# Patient Record
Sex: Female | Born: 2000
Health system: Southern US, Community
[De-identification: ages and names within clinical notes are randomized; demographics above are authoritative.]

## PROBLEM LIST (undated history)

## (undated) DIAGNOSIS — E079 Disorder of thyroid, unspecified: Secondary | ICD-10-CM

---
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
## 2020-01-21 ENCOUNTER — Ambulatory Visit: Payer: BLUE CROSS/BLUE SHIELD | Admitting: Internal Medicine

## 2020-01-21 NOTE — Progress Notes (Deleted)
Name: Emily Carey  MRN/ DOB: [id number], [birth year]    Age/ Sex: 19 y.o., female     PCP: Patient, No Pcp Per   Reason for Endocrinology Evaluation: Hyperthyroidism     Initial Endocrinology Clinic Visit: 02/19/19    PATIENT IDENTIFIER: Emily Carey is a 19 y.o., female with unremarkable past medical history. She has followed with [facility] since 02/19/2019 for consultative assistance with management of her hyperthyroidism.   HISTORICAL SUMMARY: The patient was referred after she was found to have abnormal labs at the [facility] of [facility] during evaluation for sinus tachycardia . Her TSh was suppressed < 0.01 uIU/mL , TT4 28.5 mcg/dL    She was started on methimazole in 02/2019 at a small dose due to leukopenia on initial presentation.  Paternal grandmother with goiter  SUBJECTIVE:   Today (01/21/2020):  Emily Carey is here for a 33-month follow-up on hypothyroidism.  She does endorse missing a few doses of methimazole last week due pharmacy issues   She has been noted with weight gain  Recently has noted palpitations  Has noted diarrhea    Denies abdominal pain or fever No local neck symptoms    methimazole 5 mg, to ONE AND A HALF  tablets daily  HISTORY:   Past Medical History: No past medical history on file.  Past Surgical History: No past surgical history on file.  Social History:  reports that she has never smoked. She has never used smokeless tobacco. She reports that she does not drink alcohol. No history on file for drug use. Family History:  Family History  Problem Relation Age of Onset  . High blood pressure Mother   . High Cholesterol Father   . Diabetes Maternal Grandmother   . Kidney failure Maternal Grandfather   . Diabetes Paternal Grandmother   . Heart failure Paternal Grandfather   . Diabetes Paternal Grandfather   . Kidney failure Paternal Grandfather      HOME MEDICATIONS: Allergies as of  01/21/2020   No Known Allergies     Medication List       Accurate as of January 21, 2020  8:08 AM. If you have any questions, ask your nurse or doctor.        levonorgestrel-ethinyl estradiol 0.1-20 MG-MCG tablet Commonly known as: ALESSE Take by mouth.   methimazole 5 MG tablet Commonly known as: TAPAZOLE Take 1.5 tablets (7.5 mg total) by mouth 2 (two) times daily.         OBJECTIVE:   PHYSICAL EXAM: VS: There were no vitals taken for this visit.   EXAM: General: Pt appears well and is in NAD  Neck: General: Supple without adenopathy. Thyroid: Thyroid size is prominent.  No nodules appreciated. No thyroid bruit.  Lungs: Clear with good BS bilat with no rales, rhonchi, or wheezes  Heart: Auscultation: RRR.  Abdomen: Normoactive bowel sounds, soft, nontender, without masses or organomegaly palpable  Extremities:  BL LE: No pretibial edema normal ROM and strength.  Mental Status: Judgment, insight: Intact Orientation: Oriented to time, place, and person Mood and affect: No depression, anxiety, or agitation     DATA REVIEWED:  Results for Emily Carey (MRN [id number]) as of 09/21/2019 08:26  Ref. Range 09/20/2019 10:59  TSH Latest Ref Range: 0.40 - 5.00 uIU/mL 0.42  T4,Free(Direct) Latest Ref Range: 0.60 - 1.60 ng/dL 6.23     Results for Emily Carey (MRN [id number]) as of 05/26/2019 14:21  Ref. Range 02/19/2019 13:46  TRAB Latest Ref Range: <=2.00 IU/L 6.00 (H)    ASSESSMENT / PLAN / RECOMMENDATIONS:   1. Hyperthyroidism secondary to Graves' disease:   -Patient is clinically euthyroid -No local neck symptoms -Tolerating methimazole without side effects - TFT's are normal , will make the following adjustements  Medications  Decrease methimazole 5 mg, to ONE AND A HALF  tablets daily   2.  Graves' disease  No extrathyroidal manifestations of Graves' disease.   Follow-up in 4 months     Signed electronically by: Lyndle Herrlich,  MD  [facility] Endocrinology  [facility] [address] [place], [place] [address] Phone: [phone] FAX: [phone]      CC: Patient, No Pcp Per No address on file Phone: None  Fax: None   Return to Endocrinology clinic as below: Future Appointments  Date Time Provider Department Center  01/21/2020  3:40 PM Storm Sovine, Konrad Dolores, MD [facility] None

## 2020-02-16 ENCOUNTER — Encounter: Payer: Self-pay | Admitting: Internal Medicine

## 2020-02-16 ENCOUNTER — Ambulatory Visit (INDEPENDENT_AMBULATORY_CARE_PROVIDER_SITE_OTHER): Payer: BLUE CROSS/BLUE SHIELD | Admitting: Internal Medicine

## 2020-02-16 ENCOUNTER — Other Ambulatory Visit: Payer: Self-pay

## 2020-02-16 VITALS — BP 136/86 | HR 99 | Ht 65.0 in | Wt 219.1 lb

## 2020-02-16 DIAGNOSIS — E0590 Thyrotoxicosis, unspecified without thyrotoxic crisis or storm: Secondary | ICD-10-CM | POA: Diagnosis not present

## 2020-02-16 LAB — T4, FREE: Free T4: 0.84 ng/dL (ref 0.60–1.60)

## 2020-02-16 LAB — TSH: TSH: 0.28 u[IU]/mL — ABNORMAL LOW (ref 0.40–5.00)

## 2020-02-16 NOTE — Patient Instructions (Signed)
-   Please stop by the labs today  

## 2020-02-16 NOTE — Progress Notes (Signed)
Name: Emily Carey  MRN/ DOB: [id number], 08/24/2000    Age/ Sex: 19 y.o., female     PCP: Patient, No Pcp Per   Reason for Endocrinology Evaluation: Hyperthyroidism     Initial Endocrinology Clinic Visit: 02/19/19    PATIENT IDENTIFIER: Emily Carey is a 19 y.o., female with unremarkable past medical history. She has followed with [facility] since 02/19/2019 for consultative assistance with management of her hyperthyroidism.   HISTORICAL SUMMARY: The patient was referred after she was found to have abnormal labs at the [facility] of [facility] during evaluation for sinus tachycardia . Her TSh was suppressed < 0.01 uIU/mL , TT4 28.5 mcg/dL    She was started on methimazole in 02/2019 at a small dose due to leukopenia on initial presentation.  Paternal grandmother with goiter  SUBJECTIVE:   Today (02/16/2020):  [name] is here for a follow-up on hypothyroidism.    She has been noted with weight gain  Denies palpitations, fever or diarrhea   No local neck symptoms    methimazole 5 mg, to ONE AND A HALF  tablets daily - has been taking it twice a day     HISTORY:   Past Medical History: No past medical history on file.  Past Surgical History: No past surgical history on file.  Social History:  reports that she has never smoked. She has never used smokeless tobacco. She reports that she does not drink alcohol. No history on file for drug use. Family History:  Family History  Problem Relation Age of Onset  . High blood pressure Mother   . High Cholesterol Father   . Diabetes Maternal Grandmother   . Kidney failure Maternal Grandfather   . Diabetes Paternal Grandmother   . Heart failure Paternal Grandfather   . Diabetes Paternal Grandfather   . Kidney failure Paternal Grandfather      HOME MEDICATIONS: Allergies as of 02/16/2020   No Known Allergies     Medication List       Accurate as of February 16, 2020  8:03  AM. If you have any questions, ask your nurse or doctor.        levonorgestrel-ethinyl estradiol 0.1-20 MG-MCG tablet Commonly known as: ALESSE Take by mouth.   methimazole 5 MG tablet Commonly known as: TAPAZOLE Take 1.5 tablets (7.5 mg total) by mouth 2 (two) times daily.         OBJECTIVE:   PHYSICAL EXAM: VS: BP 136/86   Pulse 99   Ht 5\' 5"  (1.651 m)   Wt 219 lb 2 oz (99.4 kg)   LMP 02/03/2020   SpO2 98%   BMI 36.46 kg/m    EXAM: General: Pt appears well and is in NAD  Neck: General: Supple without adenopathy. Thyroid: Thyroid size is prominent.  No nodules appreciated. No thyroid bruit.  Lungs: Clear with good BS bilat with no rales, rhonchi, or wheezes  Heart: Auscultation: RRR.  Abdomen: Normoactive bowel sounds, soft, nontender, without masses or organomegaly palpable  Extremities:  BL LE: No pretibial edema normal ROM and strength.  Mental Status: Judgment, insight: Intact Orientation: Oriented to time, place, and person Mood and affect: No depression, anxiety, or agitation     DATA REVIEWED:  Results for ANALYCIA, KHOKHAR (MRN Kandis Nab) as of 02/16/2020 14:06  Ref. Range 02/16/2020 08:18  TSH Latest Ref Range: 0.40 - 5.00 uIU/mL 0.28 (L)  T4,Free(Direct) Latest Ref Range: 0.60 - 1.60 ng/dL 13/01/2020   Results  for KYLLIE, PETTIJOHN (MRN [id number]) as of 05/26/2019 14:21  Ref. Range 02/19/2019 13:46  TRAB Latest Ref Range: <=2.00 IU/L 6.00 (H)    ASSESSMENT / PLAN / RECOMMENDATIONS:   1. Hyperthyroidism secondary to Graves' disease:   -There was a miscommunication about methimazole dose and instead of taking it 1.5 tabs daily , she has been taking 1.5 tabs BID  -No local neck symptoms - TFT's today Show TSH low at 0.28 uIU/Ml. I suspect medication non adherence as she is taking double the amount that was advised on last visit and her TSH is still low  - Will discuss RAI ablation on next visit , as she has been on methimazole for a year without remission and  still requires high dose of methimazole   Medications  Continue methimazole 5 mg, 1.5 tabs BID    2.  Graves' disease  No extrathyroidal manifestations of Graves' disease.   Follow-up in 4 months Labs in 8 weeks    Addendum: Attempted to call the pt on 02/16/2020 but the call did not go through and a message appeared stating to call back later  letter will be sent      Signed electronically by: Lyndle Herrlich, MD  [facility] Endocrinology  [facility] [address] [place] [place], [place] [address] Phone: [phone] FAX: [phone]      CC: Patient, No Pcp Per No address on file Phone: None  Fax: None   Return to Endocrinology clinic as below: No future appointments.

## 2020-02-24 IMAGING — US US THYROID
1 series · 14 of 25 positions shown · non-contrast
Comparison: None.

CLINICAL DATA: Thyromegaly on exam

EXAM:
THYROID ULTRASOUND
TECHNIQUE: Ultrasound examination of the thyroid gland and adjacent soft
tissues was performed.

[Series 1: us thyroid · 0.08mm/px · 35 acquisitions, 14 frames shown]
[im 1/35]
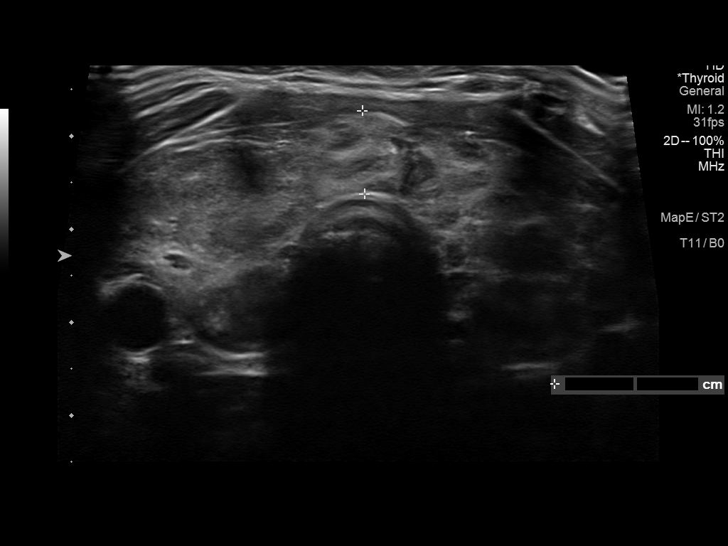
[im 3/35]
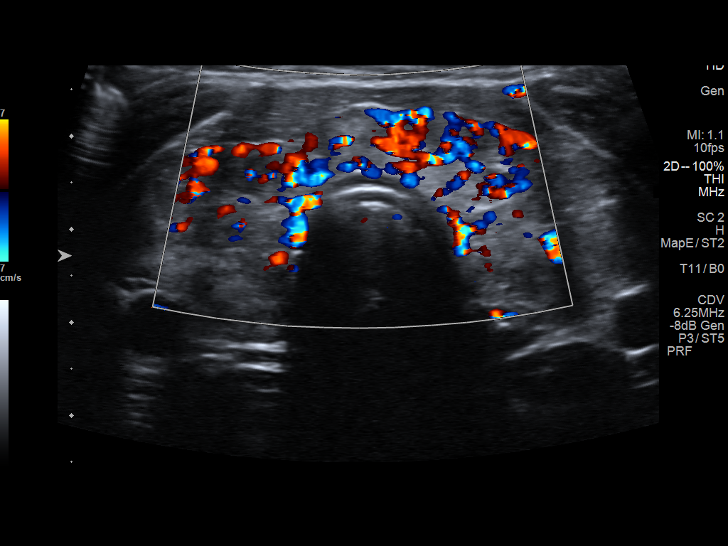
[im 6/35]
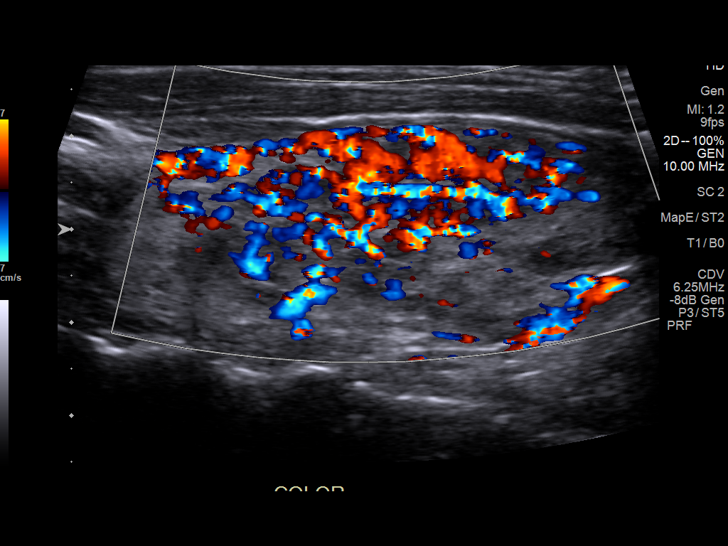
[im 9/35]
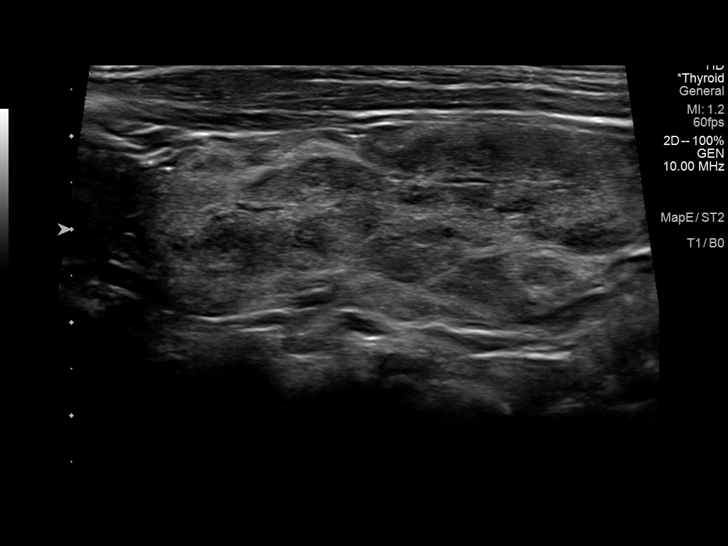
[im 12/35]
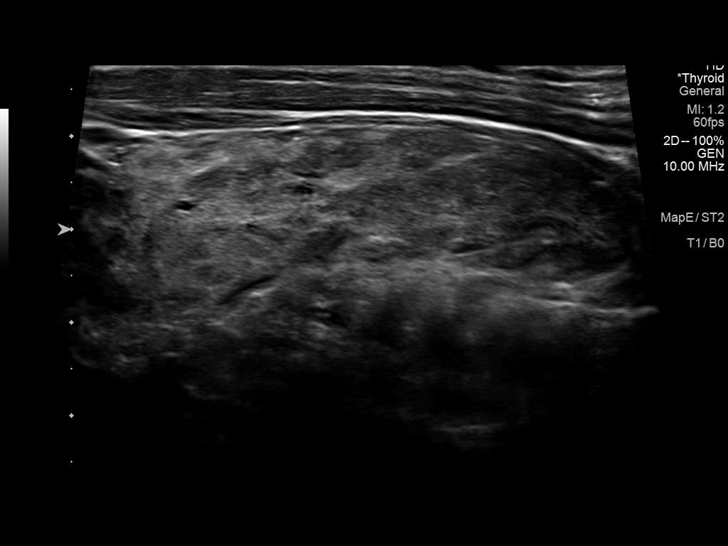
[im 13/35]
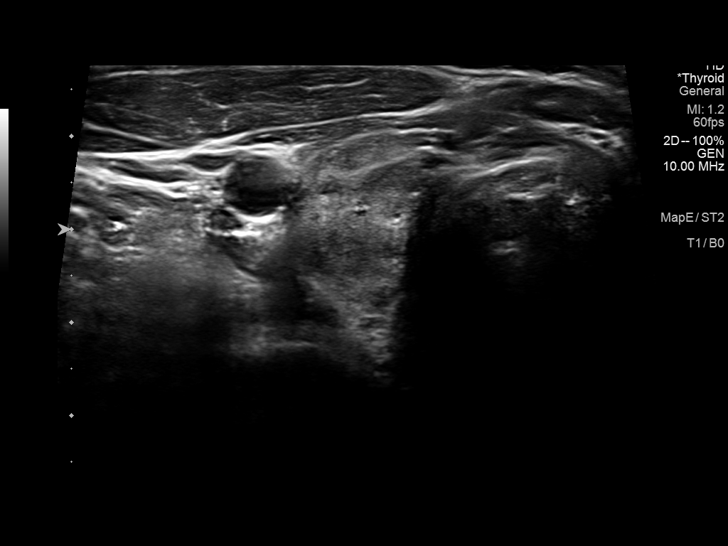
[im 16/35]
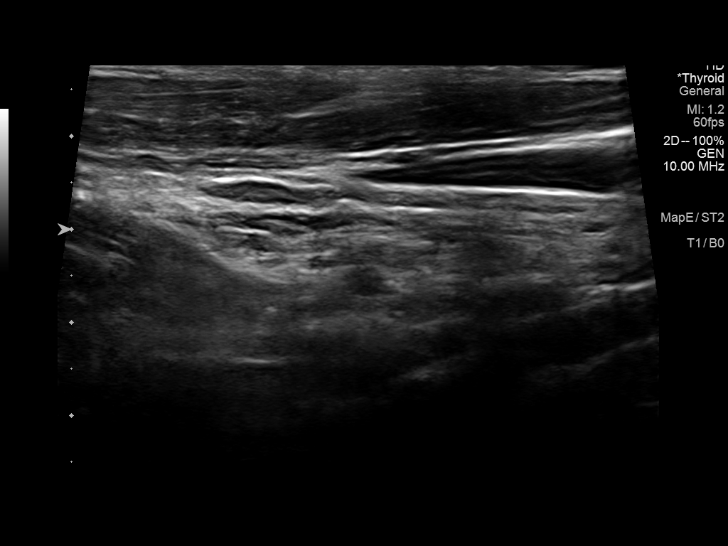
[im 19/35]
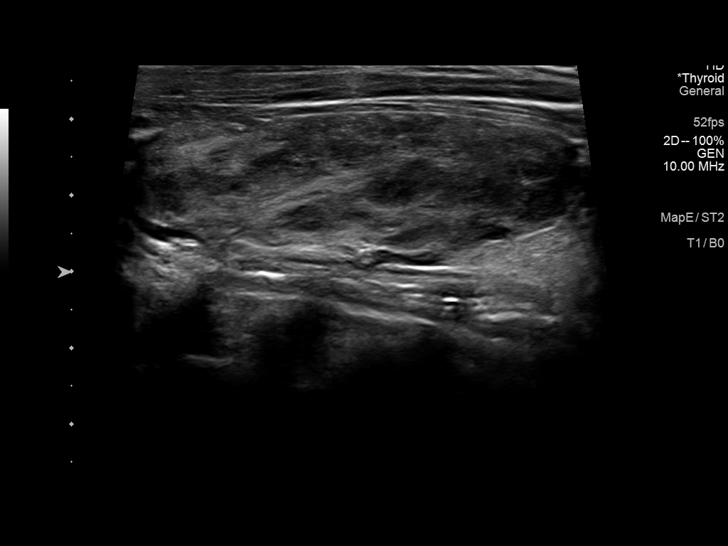
[im 22/35]
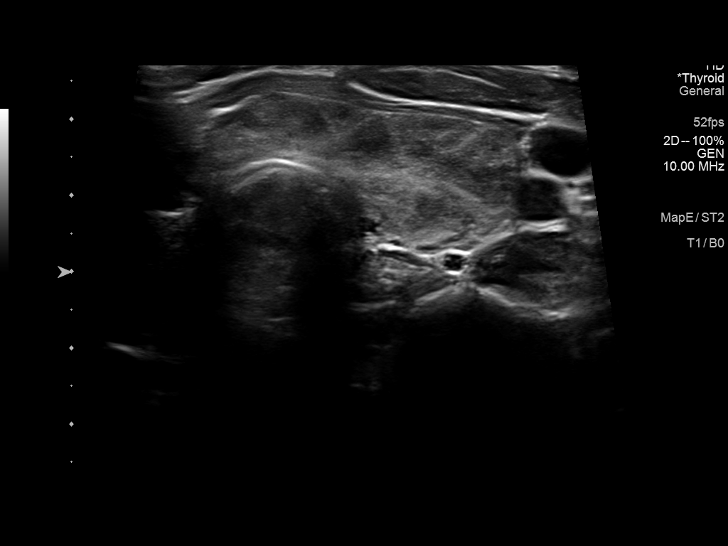
[im 23/35]
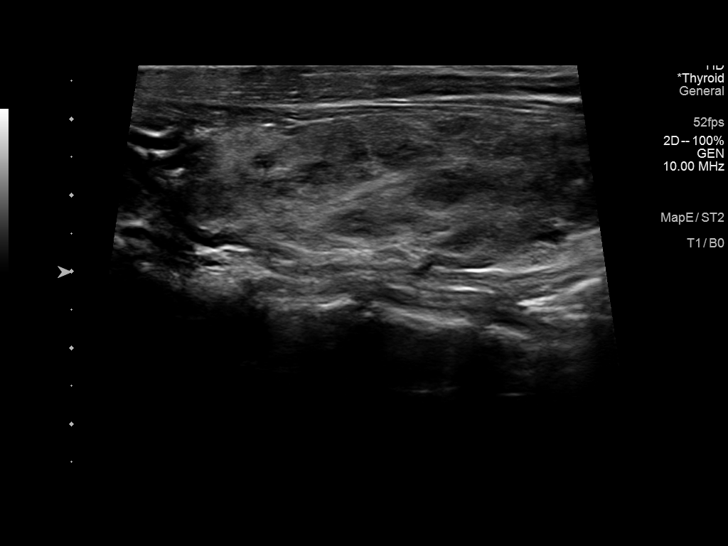
[im 26/35]
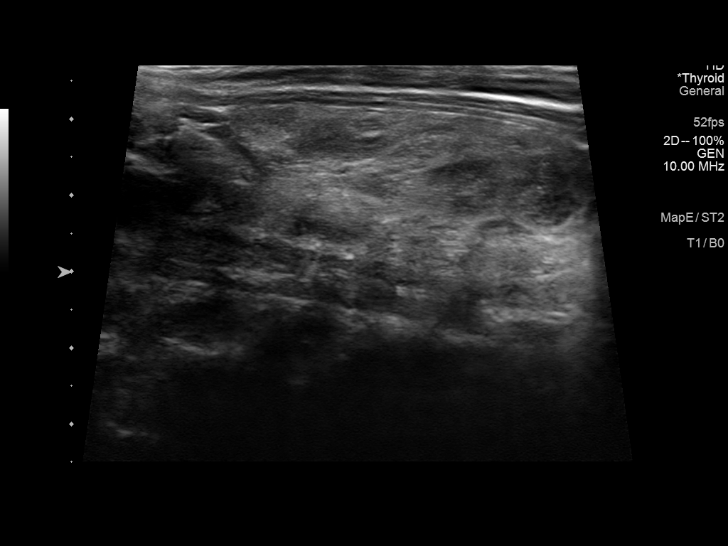
[im 29/35]
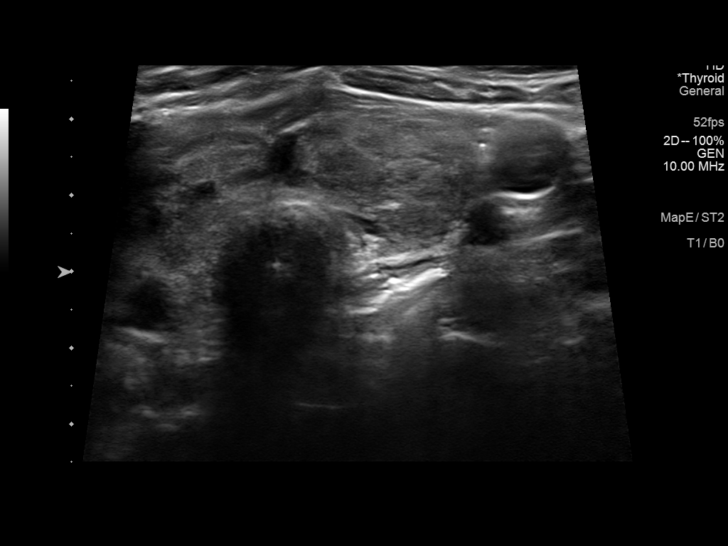
[im 32/35]
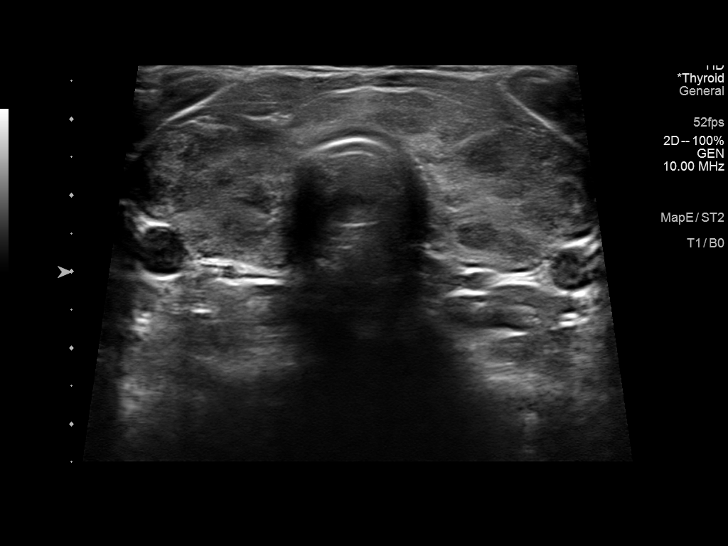
[im 35/35]
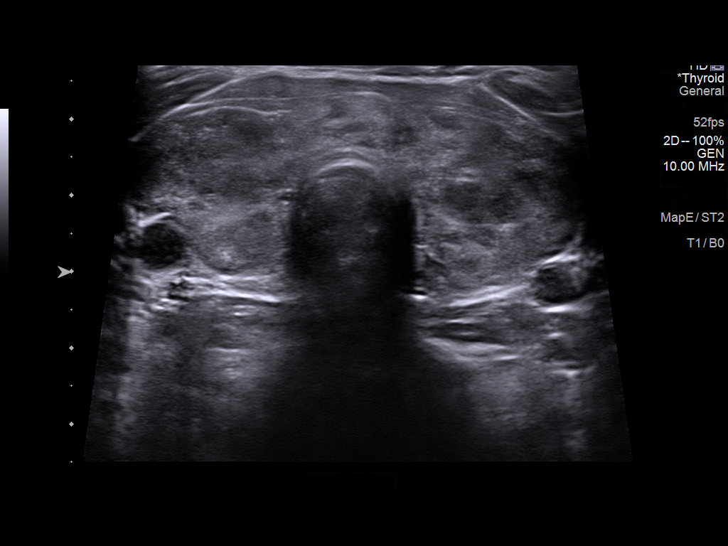

[14 of 25 positions shown; findings below may reference images not displayed]

FINDINGS: Parenchymal Echotexture: Markedly heterogenous

Isthmus: 9 mm

Right lobe: 5.7 x 2.3 x 2.3 cm

Left lobe: 6.0 x 1.9 x 2.3 cm

_________________________________________________________

Estimated total number of nodules >/= 1 cm: 0

Number of spongiform nodules >/=  2 cm not described below (TR1): 0

Number of mixed cystic and solid nodules >/= 1.5 cm not described
below (TR2): 0

_________________________________________________________

Mild thyromegaly with marked heterogeneity and background diffuse
pseudo nodularity. Thyroid is also hypervascular. Appearance is
nonspecific but can be seen with thyroiditis or Graves disease.
Correlate clinically. No regional adenopathy.
IMPRESSION: Thyromegaly with heterogeneity and hypervascularity. See above
comment.

The above is in keeping with the ACR TI-RADS recommendations - [HOSPITAL] 4496;[DATE].

## 2020-04-20 ENCOUNTER — Other Ambulatory Visit: Payer: BLUE CROSS/BLUE SHIELD

## 2020-04-26 ENCOUNTER — Other Ambulatory Visit (INDEPENDENT_AMBULATORY_CARE_PROVIDER_SITE_OTHER): Payer: BLUE CROSS/BLUE SHIELD

## 2020-04-26 ENCOUNTER — Other Ambulatory Visit: Payer: Self-pay

## 2020-04-26 DIAGNOSIS — E0590 Thyrotoxicosis, unspecified without thyrotoxic crisis or storm: Secondary | ICD-10-CM

## 2020-04-26 LAB — TSH: TSH: 0.65 u[IU]/mL (ref 0.40–5.00)

## 2020-04-26 LAB — T4, FREE: Free T4: 0.8 ng/dL (ref 0.60–1.60)

## 2020-06-14 ENCOUNTER — Other Ambulatory Visit: Payer: Self-pay

## 2020-06-16 ENCOUNTER — Ambulatory Visit (INDEPENDENT_AMBULATORY_CARE_PROVIDER_SITE_OTHER): Payer: BLUE CROSS/BLUE SHIELD | Admitting: Internal Medicine

## 2020-06-16 ENCOUNTER — Encounter: Payer: Self-pay | Admitting: Internal Medicine

## 2020-06-16 ENCOUNTER — Other Ambulatory Visit: Payer: Self-pay

## 2020-06-16 VITALS — BP 136/86 | HR 76 | Ht 65.0 in | Wt 230.2 lb

## 2020-06-16 DIAGNOSIS — E0590 Thyrotoxicosis, unspecified without thyrotoxic crisis or storm: Secondary | ICD-10-CM | POA: Diagnosis not present

## 2020-06-16 DIAGNOSIS — E0500 Thyrotoxicosis with diffuse goiter without thyrotoxic crisis or storm: Secondary | ICD-10-CM

## 2020-06-16 LAB — CBC WITH DIFFERENTIAL/PLATELET
Basophils Absolute: 0 10*3/uL (ref 0.0–0.1)
Basophils Relative: 0.8 % (ref 0.0–3.0)
Eosinophils Absolute: 0 10*3/uL (ref 0.0–0.7)
Eosinophils Relative: 0.8 % (ref 0.0–5.0)
HCT: 35.5 % — ABNORMAL LOW (ref 36.0–49.0)
Hemoglobin: 11.6 g/dL — ABNORMAL LOW (ref 12.0–16.0)
Lymphocytes Relative: 33.7 % (ref 24.0–48.0)
Lymphs Abs: 1.6 10*3/uL (ref 0.7–4.0)
MCHC: 32.8 g/dL (ref 31.0–37.0)
MCV: 82.5 fl (ref 78.0–98.0)
Monocytes Absolute: 0.5 10*3/uL (ref 0.1–1.0)
Monocytes Relative: 9.6 % (ref 3.0–12.0)
Neutro Abs: 2.7 10*3/uL (ref 1.4–7.7)
Neutrophils Relative %: 55.1 % (ref 43.0–71.0)
Platelets: 405 10*3/uL (ref 150.0–575.0)
RBC: 4.3 Mil/uL (ref 3.80–5.70)
RDW: 14.9 % (ref 11.4–15.5)
WBC: 4.9 10*3/uL (ref 4.5–13.5)

## 2020-06-16 LAB — T4, FREE: Free T4: 0.92 ng/dL (ref 0.60–1.60)

## 2020-06-16 LAB — COMPREHENSIVE METABOLIC PANEL
ALT: 26 U/L (ref 0–35)
AST: 19 U/L (ref 0–37)
Albumin: 4.1 g/dL (ref 3.5–5.2)
Alkaline Phosphatase: 42 U/L — ABNORMAL LOW (ref 47–119)
BUN: 8 mg/dL (ref 6–23)
CO2: 27 mEq/L (ref 19–32)
Calcium: 9.6 mg/dL (ref 8.4–10.5)
Chloride: 107 mEq/L (ref 96–112)
Creatinine, Ser: 0.9 mg/dL (ref 0.40–1.20)
GFR: 92.38 mL/min (ref >60.00)
Glucose, Bld: 87 mg/dL (ref 70–99)
Potassium: 4.2 mEq/L (ref 3.5–5.1)
Sodium: 140 mEq/L (ref 135–145)
Total Bilirubin: 0.4 mg/dL (ref 0.2–1.2)
Total Protein: 7.7 g/dL (ref 6.0–8.3)

## 2020-06-16 LAB — TSH: TSH: 0.31 u[IU]/mL — ABNORMAL LOW (ref 0.40–5.00)

## 2020-06-16 NOTE — Patient Instructions (Signed)
Thyroid Uptake and Scan works like this: We would first check a thyroid "scan" (a special, but easy and painless type of thyroid x ray).  you go to the x-ray department of the hospital to swallow a pill, which contains a miniscule amount of radiation.  You will not notice any symptoms from this.  You will go back to the x-ray department the next day, to lie down in front of a camera.  The results of this will be sent to me.    

## 2020-06-16 NOTE — Progress Notes (Signed)
Name: Emily Carey  MRN/ DOB: [id number], March 22, 2001    Age/ Sex: 20 y.o., female     PCP: Patient, No Pcp Per   Reason for Endocrinology Evaluation: Hyperthyroidism     Initial Endocrinology Clinic Visit: 02/19/19    PATIENT IDENTIFIER: Emily Carey is a 20 y.o., female with unremarkable past medical history. She has followed with [facility] since 02/19/2019 for consultative assistance with management of her hyperthyroidism.   HISTORICAL SUMMARY: The patient was referred after she was found to have abnormal labs at the [facility] during evaluation for sinus tachycardia . Her TSh was suppressed < 0.01 uIU/mL , TT4 28.5 mcg/dL    She was started on methimazole in 02/2019 at a small dose due to leukopenia on initial presentation.  Paternal grandmother with goiter  SUBJECTIVE:   Today (06/16/2020):  Emily Carey is here for a follow-up on hypothyroidism.    She has been noted with weight gain  Denies palpitations, fever or diarrhea   No local neck symptoms    methimazole 5 mg, to ONE AND A HALF  tablets BID  She is on Alessa for OCP's    HISTORY:   Past Medical History: No past medical history on file.  Past Surgical History: No past surgical history on file.  Social History:  reports that she has never smoked. She has never used smokeless tobacco. She reports that she does not drink alcohol. No history on file for drug use. Family History:  Family History  Problem Relation Age of Onset  . High blood pressure Mother   . High Cholesterol Father   . Diabetes Maternal Grandmother   . Kidney failure Maternal Grandfather   . Diabetes Paternal Grandmother   . Heart failure Paternal Grandfather   . Diabetes Paternal Grandfather   . Kidney failure Paternal Grandfather      HOME MEDICATIONS: Allergies as of 06/16/2020   No Known Allergies     Medication List       Accurate as of June 16, 2020  8:44 AM. If you  have any questions, ask your nurse or doctor.        levonorgestrel-ethinyl estradiol 0.1-20 MG-MCG tablet Commonly known as: ALESSE Take by mouth.   methimazole 5 MG tablet Commonly known as: TAPAZOLE Take 1.5 tablets (7.5 mg total) by mouth 2 (two) times daily.         OBJECTIVE:   PHYSICAL EXAM: VS: BP 136/86   Pulse 76   Ht 5\' 5"  (1.651 m)   Wt 230 lb 4 oz (104.4 kg)   LMP 05/13/2020   SpO2 96%   BMI 38.32 kg/m    EXAM: General: Pt appears well and is in NAD  Neck: General: Supple without adenopathy. Thyroid: Thyroid size is prominent.  No nodules appreciated. No thyroid bruit.  Lungs: Clear with good BS bilat with no rales, rhonchi, or wheezes  Heart: Auscultation: RRR.  Abdomen: Normoactive bowel sounds, soft, nontender, without masses or organomegaly palpable  Extremities:  BL LE: No pretibial edema normal ROM and strength.  Mental Status: Judgment, insight: Intact Orientation: Oriented to time, place, and person Mood and affect: No depression, anxiety, or agitation     DATA REVIEWED: Results for Emily, Carey (MRN Emily Carey) as of 06/16/2020 15:18  Ref. Range 06/16/2020 08:55  TSH Latest Ref Range: 0.40 - 5.00 uIU/mL 0.31 (L)  T4,Free(Direct) Latest Ref Range: 0.60 - 1.60 ng/dL 08/16/2020    Results for Emily Carey,  Emily Carey (MRN Emily Carey) as of 05/26/2019 14:21  Ref. Range 02/19/2019 13:46  TRAB Latest Ref Range: <=2.00 IU/L 6.00 (H)    ASSESSMENT / PLAN / RECOMMENDATIONS:   1. Hyperthyroidism secondary to Graves' disease:   - Pt is clinically euthyroid - No local neck symptoms - I have recommended RAI ablation , which she agreed to, will proceed with thyroid uptake and scan  - I carefully explained to the patient that one of the consequences of I-131 ablation treatment would likely be permanent hypothyroidism which would require long-term replacement therapy with LT4. - She was also made aware to avoid pregnancy 6 month after RAI ablation  - TSh low again,  suspect imperfect medication adherence, no changes  - She was also advised to hold methimazole 5-7 days prior the uptake and scan as well as RAI ablation     Medications  Continue methimazole 5 mg, 1.5 tabs BID    2.  Graves' disease  No extrathyroidal manifestations of Graves' disease.   Follow-up in 3 months      Signed electronically by: Lyndle Herrlich, MD  [facility]  [facility] [address] [place], [place] [address] Phone: [phone] FAX: [phone]      CC: Patient, No Pcp Per No address on file Phone: None  Fax: None   Return to Endocrinology clinic as below: No future appointments.

## 2020-07-04 ENCOUNTER — Encounter (HOSPITAL_COMMUNITY): Payer: BLUE CROSS/BLUE SHIELD

## 2020-07-05 ENCOUNTER — Encounter (HOSPITAL_COMMUNITY): Payer: BLUE CROSS/BLUE SHIELD

## 2020-07-20 ENCOUNTER — Other Ambulatory Visit: Payer: Self-pay

## 2020-07-20 ENCOUNTER — Encounter (HOSPITAL_COMMUNITY)
Admission: RE | Admit: 2020-07-20 | Discharge: 2020-07-20 | Disposition: A | Discharge status: Home / Self Care | Payer: BLUE CROSS/BLUE SHIELD | Source: Ambulatory Visit | Attending: Internal Medicine | Admitting: Internal Medicine

## 2020-07-20 ENCOUNTER — Ambulatory Visit (HOSPITAL_COMMUNITY)
Admission: RE | Admit: 2020-07-20 | Discharge: 2020-07-20 | Disposition: A | Discharge status: Home / Self Care | Payer: BLUE CROSS/BLUE SHIELD | Source: Ambulatory Visit | Attending: Internal Medicine | Admitting: Internal Medicine

## 2020-07-20 DIAGNOSIS — E0590 Thyrotoxicosis, unspecified without thyrotoxic crisis or storm: Secondary | ICD-10-CM | POA: Diagnosis not present

## 2020-07-20 DIAGNOSIS — E0500 Thyrotoxicosis with diffuse goiter without thyrotoxic crisis or storm: Secondary | ICD-10-CM | POA: Insufficient documentation

## 2020-07-21 ENCOUNTER — Ambulatory Visit (HOSPITAL_COMMUNITY)
Admission: RE | Admit: 2020-07-21 | Discharge: 2020-07-21 | Disposition: A | Discharge status: Home / Self Care | Payer: BLUE CROSS/BLUE SHIELD | Source: Ambulatory Visit | Attending: Internal Medicine | Admitting: Internal Medicine

## 2020-07-21 DIAGNOSIS — R5383 Other fatigue: Secondary | ICD-10-CM | POA: Diagnosis not present

## 2020-07-21 DIAGNOSIS — E0500 Thyrotoxicosis with diffuse goiter without thyrotoxic crisis or storm: Secondary | ICD-10-CM | POA: Diagnosis not present

## 2020-07-21 DIAGNOSIS — R197 Diarrhea, unspecified: Secondary | ICD-10-CM | POA: Diagnosis not present

## 2020-07-21 DIAGNOSIS — F419 Anxiety disorder, unspecified: Secondary | ICD-10-CM | POA: Diagnosis not present

## 2020-07-21 MED ORDER — SODIUM IODIDE I-123 7.4 MBQ CAPS
434.3000 | ORAL_CAPSULE | Freq: Once | ORAL | Status: AC
  Administered 2020-07-21: 434.3 via ORAL

## 2020-07-24 ENCOUNTER — Other Ambulatory Visit: Payer: Self-pay | Admitting: Internal Medicine

## 2020-07-24 DIAGNOSIS — E0590 Thyrotoxicosis, unspecified without thyrotoxic crisis or storm: Secondary | ICD-10-CM

## 2020-07-24 MED ORDER — ATENOLOL 25 MG PO TABS: 25.0000 mg | ORAL_TABLET | Freq: Every day | ORAL | 3 refills | Status: DC

## 2020-08-03 ENCOUNTER — Telehealth: Payer: Self-pay | Admitting: Internal Medicine

## 2020-08-03 MED ORDER — ATENOLOL 25 MG PO TABS: 25.0000 mg | ORAL_TABLET | Freq: Every day | ORAL | 3 refills | Status: DC

## 2020-08-03 NOTE — Telephone Encounter (Signed)
It was refill on 07/24/2020 but looks like there was an error. Will resent Rx

## 2020-08-03 NOTE — Telephone Encounter (Signed)
MEDICATION: atenolol  PHARMACY:   [facility] - [place], [place] [address] Bong Phone:  [phone]  Fax:  [phone]      HAS THE PATIENT CONTACTED THEIR PHARMACY?  yes  IS THIS A 90 DAY SUPPLY : no  IS PATIENT OUT OF MEDICATION: yes  IF NOT; HOW MUCH IS LEFT:   LAST APPOINTMENT DATE: @3 /02/2021  NEXT APPOINTMENT DATE:@6 /24/2022  DO WE HAVE YOUR PERMISSION TO LEAVE A DETAILED MESSAGE?:  OTHER COMMENTS:    **Let patient know to contact pharmacy at the end of the day to make sure medication is ready. **  ** Please notify patient to allow 48-72 hours to process**  **Encourage patient to contact the pharmacy for refills or they can request refills through [facility]**

## 2020-08-04 ENCOUNTER — Other Ambulatory Visit: Payer: Self-pay

## 2020-08-04 ENCOUNTER — Ambulatory Visit (HOSPITAL_COMMUNITY)
Admission: RE | Admit: 2020-08-04 | Discharge: 2020-08-04 | Disposition: A | Discharge status: Home / Self Care | Payer: BLUE CROSS/BLUE SHIELD | Source: Ambulatory Visit | Attending: Internal Medicine | Admitting: Internal Medicine

## 2020-08-04 DIAGNOSIS — E0590 Thyrotoxicosis, unspecified without thyrotoxic crisis or storm: Secondary | ICD-10-CM | POA: Diagnosis not present

## 2020-08-04 LAB — HCG, SERUM, QUALITATIVE: Preg, Serum: NEGATIVE

## 2020-08-04 MED ORDER — SODIUM IODIDE I 131 CAPSULE: 22.2000 | Freq: Once | INTRAVENOUS | Status: DC | PRN

## 2020-08-09 ENCOUNTER — Telehealth: Payer: Self-pay | Admitting: Internal Medicine

## 2020-08-09 NOTE — Telephone Encounter (Signed)
Please call the pt and schedule her for a " Lab appointment " the last week of May     Thanks

## 2020-08-29 ENCOUNTER — Other Ambulatory Visit: Payer: Self-pay

## 2020-08-29 ENCOUNTER — Other Ambulatory Visit (INDEPENDENT_AMBULATORY_CARE_PROVIDER_SITE_OTHER): Payer: BLUE CROSS/BLUE SHIELD

## 2020-08-29 DIAGNOSIS — E0590 Thyrotoxicosis, unspecified without thyrotoxic crisis or storm: Secondary | ICD-10-CM | POA: Diagnosis not present

## 2020-08-29 LAB — T4, FREE: Free T4: 1.13 ng/dL (ref 0.60–1.60)

## 2020-08-29 LAB — TSH: TSH: 0.05 u[IU]/mL — ABNORMAL LOW (ref 0.35–5.50)

## 2020-08-30 ENCOUNTER — Encounter: Payer: Self-pay | Admitting: Internal Medicine

## 2020-08-31 NOTE — Telephone Encounter (Signed)
Please advise 

## 2020-09-29 ENCOUNTER — Ambulatory Visit (INDEPENDENT_AMBULATORY_CARE_PROVIDER_SITE_OTHER): Payer: BLUE CROSS/BLUE SHIELD | Admitting: Internal Medicine

## 2020-09-29 ENCOUNTER — Other Ambulatory Visit: Payer: Self-pay

## 2020-09-29 VITALS — BP 120/80 | HR 94 | Ht 64.0 in | Wt 230.6 lb

## 2020-09-29 DIAGNOSIS — E0500 Thyrotoxicosis with diffuse goiter without thyrotoxic crisis or storm: Secondary | ICD-10-CM | POA: Diagnosis not present

## 2020-09-29 LAB — T4, FREE: Free T4: 1.09 ng/dL (ref 0.60–1.60)

## 2020-09-29 LAB — TSH: TSH: <0.01 u[IU]/mL — ABNORMAL LOW (ref 0.35–5.50)

## 2020-09-29 NOTE — Progress Notes (Signed)
Name: Emily Carey  MRN/ DOB: [id number], 2000-04-27    Age/ Sex: 20 y.o., female     PCP: Patient, No Pcp Per (Inactive)   Reason for Endocrinology Evaluation: Hyperthyroidism     Initial Endocrinology Clinic Visit: 02/19/19    PATIENT IDENTIFIER: Emily Carey is a 20 y.o., female with unremarkable past medical history. She has followed with [facility] since 02/19/2019 for consultative assistance with management of her hyperthyroidism.   HISTORICAL SUMMARY: The patient was referred after she was found to have abnormal labs at the [facility] health center of [facility] during evaluation for sinus tachycardia . Her TSh was suppressed < 0.01 uIU/mL , TT4 28.5 mcg/dL    She was started on methimazole in 02/2019 at a small dose due to leukopenia on initial presentation.  She is S/P RAI ablation on 08/04/2020 with 22.2 mCi I-131 sodium iodide orally  Paternal grandmother with goiter  SUBJECTIVE:   Today (09/29/2020):  Emily Carey is here for a follow-up on hypothyroidism.    S/P RAI ablation in 07/2020 with 22.2 mCi I-131 sodium iodide orally  Weight stable  Denies constipation  Denies local neck symptonms  Denies palpitations     She is on Alessa for OCP's    HISTORY:  Past Medical History: No past medical history on file. Past Surgical History: No past surgical history on file. Social History:  reports that she has never smoked. She has never used smokeless tobacco. She reports that she does not drink alcohol. No history on file for drug use. Family History:  Family History  Problem Relation Age of Onset   High blood pressure Mother    High Cholesterol Father    Diabetes Maternal Grandmother    Kidney failure Maternal Grandfather    Diabetes Paternal Grandmother    Heart failure Paternal Grandfather    Diabetes Paternal Grandfather    Kidney failure Paternal Grandfather      HOME MEDICATIONS: Allergies as of 09/29/2020   No Known  Allergies      Medication List        Accurate as of September 29, 2020 12:47 PM. If you have any questions, ask your nurse or doctor.          atenolol 25 MG tablet Commonly known as: TENORMIN Take 1 tablet (25 mg total) by mouth daily.   levonorgestrel-ethinyl estradiol 0.1-20 MG-MCG tablet Commonly known as: ALESSE Take by mouth.          OBJECTIVE:   PHYSICAL EXAM: VS: BP 120/80   Pulse 94   Ht 5\' 4"  (1.626 m)   Wt 230 lb 9.6 oz (104.6 kg)   SpO2 99%   BMI 39.58 kg/m    EXAM: General: Pt appears well and is in NAD  Neck: General: Supple without adenopathy. Thyroid: Thyroid size is prominent.  No nodules appreciated. No thyroid bruit.  Lungs: Clear with good BS bilat with no rales, rhonchi, or wheezes  Heart: Auscultation: RRR.  Abdomen: Normoactive bowel sounds, soft, nontender, without masses or organomegaly palpable  Extremities:  BL LE: No pretibial edema normal ROM and strength.  Mental Status: Judgment, insight: Intact Orientation: Oriented to time, place, and person Mood and affect: No depression, anxiety, or agitation     DATA REVIEWED:  Results for OLAMAE, FERRARA (MRN Kandis Nab) as of 10/01/2020 09:55  Ref. Range 09/29/2020 15:58  TSH Latest Ref Range: 0.35 - 5.50 uIU/mL <0.01 (L)  T4,Free(Direct) Latest Ref Range: 0.60 - 1.60 ng/dL  1.09    Results for KASSI, ESTEVE (MRN [id number]) as of 05/26/2019 14:21  Ref. Range 02/19/2019 13:46  TRAB Latest Ref Range: <=2.00 IU/L 6.00 (H)    ASSESSMENT / PLAN / RECOMMENDATIONS:   Hyperthyroidism secondary to Graves' disease:   - Pt is clinically euthyroid - No local neck symptoms - She is aware to avoid pregnancy 6 month after RAI ablation  - TFT's remain stable.    Medications  Continue Atenolol 25  mg daily    2.  Graves' disease  No extrathyroidal manifestations of Graves' disease.   Follow-up in 4 months  Labs in 8 weeks     Signed electronically by: Lyndle Herrlich,  MD  [facility]  [facility] [address] [place], [place] [address] Phone: [phone] FAX: [phone]      CC: Patient, No Pcp Per (Inactive) No address on file Phone: None  Fax: None   Return to Endocrinology clinic as below: Future Appointments  Date Time Provider Department Center  09/29/2020  3:40 PM Emily Carey, Konrad Dolores, MD [facility] None

## 2020-10-01 ENCOUNTER — Encounter: Payer: Self-pay | Admitting: Internal Medicine

## 2020-11-24 ENCOUNTER — Other Ambulatory Visit: Payer: Self-pay

## 2020-11-24 ENCOUNTER — Other Ambulatory Visit (INDEPENDENT_AMBULATORY_CARE_PROVIDER_SITE_OTHER): Payer: Self-pay

## 2020-11-24 ENCOUNTER — Telehealth: Payer: Self-pay | Admitting: Internal Medicine

## 2020-11-24 DIAGNOSIS — E0500 Thyrotoxicosis with diffuse goiter without thyrotoxic crisis or storm: Secondary | ICD-10-CM

## 2020-11-24 LAB — TSH: TSH: 12.3 u[IU]/mL — ABNORMAL HIGH (ref 0.35–5.50)

## 2020-11-24 LAB — T4, FREE: Free T4: 0.51 ng/dL — ABNORMAL LOW (ref 0.60–1.60)

## 2020-11-24 MED ORDER — LEVOTHYROXINE SODIUM 75 MCG PO TABS: 75.0000 ug | ORAL_TABLET | Freq: Every day | ORAL | 3 refills | Status: DC

## 2020-11-24 NOTE — Telephone Encounter (Signed)
Attempted to call the patient on 11/24/2020 at 1745.  Left a message for the patient to pick up prescription from [facility] a portal message will be sent as well    Abby Raelyn Mora, MD  [facility]  [facility] [address] [place], [place] [address] Phone: [phone] FAX: [phone]

## 2021-02-02 ENCOUNTER — Ambulatory Visit: Payer: BLUE CROSS/BLUE SHIELD | Admitting: Internal Medicine

## 2021-02-02 NOTE — Progress Notes (Deleted)
Name: Emily Carey  MRN/ DOB: [id number], October 12, 2000    Age/ Sex: 20 y.o., female     PCP: Patient, No Pcp Per (Inactive)   Reason for Endocrinology Evaluation: Hyperthyroidism     Initial Endocrinology Clinic Visit: 02/19/19    PATIENT IDENTIFIER: [name] is a 20 y.o., female with unremarkable past medical history. She has followed with [facility] since 02/19/2019 for consultative assistance with management of her hyperthyroidism.   HISTORICAL SUMMARY: The patient was referred after she was found to have abnormal labs at the [facility] during evaluation for sinus tachycardia . Her TSh was suppressed < 0.01 uIU/mL , TT4 28.5 mcg/dL    She was started on methimazole in 02/2019 at a small dose due to leukopenia on initial presentation.  She is S/P RAI ablation on 08/04/2020 with 22.2 mCi I-131 sodium iodide orally  She was started on LT-4 replacement in 11/2020  Paternal grandmother with goiter  SUBJECTIVE:   Today (02/02/2021):  Emily Carey is here for a follow-up on hypothyroidism.    S/P RAI ablation in 07/2020 with 22.2 mCi I-131 sodium iodide orally  Weight stable  Denies constipation  Denies local neck symptonms  Denies palpitations     She is on Alessa for OCP's  Levothyroxine 75 mcg daily   HISTORY:  Past Medical History: No past medical history on file. Past Surgical History: No past surgical history on file. Social History:  reports that she has never smoked. She has never used smokeless tobacco. She reports that she does not drink alcohol. No history on file for drug use. Family History:  Family History  Problem Relation Age of Onset   High blood pressure Mother    High Cholesterol Father    Diabetes Maternal Grandmother    Kidney failure Maternal Grandfather    Diabetes Paternal Grandmother    Heart failure Paternal Grandfather    Diabetes Paternal Grandfather    Kidney failure Paternal  Grandfather      HOME MEDICATIONS: Allergies as of 02/02/2021   No Known Allergies      Medication List        Accurate as of February 02, 2021  7:43 AM. If you have any questions, ask your nurse or doctor.          levonorgestrel-ethinyl estradiol 0.1-20 MG-MCG tablet Commonly known as: ALESSE Take by mouth.   levothyroxine 75 MCG tablet Commonly known as: SYNTHROID Take 1 tablet (75 mcg total) by mouth daily.          OBJECTIVE:   PHYSICAL EXAM: VS: There were no vitals taken for this visit.   EXAM: General: Pt appears well and is in NAD  Neck: General: Supple without adenopathy. Thyroid: Thyroid size is prominent.  No nodules appreciated. No thyroid bruit.  Lungs: Clear with good BS bilat with no rales, rhonchi, or wheezes  Heart: Auscultation: RRR.  Abdomen: Normoactive bowel sounds, soft, nontender, without masses or organomegaly palpable  Extremities:  BL LE: No pretibial edema normal ROM and strength.  Mental Status: Judgment, insight: Intact Orientation: Oriented to time, place, and person Mood and affect: No depression, anxiety, or agitation     DATA REVIEWED:  Results for ABRIEL, HATTERY (MRN [id number]) as of 10/01/2020 09:55  Ref. Range 09/29/2020 15:58  TSH Latest Ref Range: 0.35 - 5.50 uIU/mL <0.01 (L)  T4,Free(Direct) Latest Ref Range: 0.60 - 1.60 ng/dL 2.99    Results for AULANI, SHIPTON (MRN [id number])  as of 05/26/2019 14:21  Ref. Range 02/19/2019 13:46  TRAB Latest Ref Range: <=2.00 IU/L 6.00 (H)    ASSESSMENT / PLAN / RECOMMENDATIONS:   Hyperthyroidism secondary to Graves' disease:   - Pt is clinically euthyroid - No local neck symptoms - She is aware to avoid pregnancy 6 month after RAI ablation  - TFT's remain stable.    Medications  Continue Atenolol 25  mg daily    2.  Graves' disease  No extrathyroidal manifestations of Graves' disease.   Follow-up in 4 months   Signed electronically by: Lyndle Herrlich,  MD  [facility]  [facility] [address] [place], [place] [address] Phone: [phone] FAX: [phone]      CC: Patient, No Pcp Per (Inactive) No address on file Phone: None  Fax: None   Return to Endocrinology clinic as below: Future Appointments  Date Time Provider Department Center  02/02/2021 11:10 AM Ever Halberg, Konrad Dolores, MD [facility] None

## 2021-04-20 ENCOUNTER — Other Ambulatory Visit: Payer: Self-pay

## 2021-04-20 ENCOUNTER — Encounter: Payer: Self-pay | Admitting: Internal Medicine

## 2021-04-20 ENCOUNTER — Ambulatory Visit (INDEPENDENT_AMBULATORY_CARE_PROVIDER_SITE_OTHER): Payer: Self-pay | Admitting: Internal Medicine

## 2021-04-20 VITALS — BP 130/78 | HR 78 | Ht 64.0 in | Wt 244.0 lb

## 2021-04-20 DIAGNOSIS — E890 Postprocedural hypothyroidism: Secondary | ICD-10-CM | POA: Insufficient documentation

## 2021-04-20 DIAGNOSIS — E0500 Thyrotoxicosis with diffuse goiter without thyrotoxic crisis or storm: Secondary | ICD-10-CM

## 2021-04-20 LAB — T4, FREE: Free T4: 0.92 ng/dL (ref 0.60–1.60)

## 2021-04-20 LAB — TSH: TSH: 3.14 u[IU]/mL (ref 0.35–5.50)

## 2021-04-20 MED ORDER — LEVOTHYROXINE SODIUM 75 MCG PO TABS: 75.0000 ug | ORAL_TABLET | Freq: Every day | ORAL | 3 refills | Status: DC

## 2021-04-20 NOTE — Patient Instructions (Signed)

## 2021-04-20 NOTE — Progress Notes (Signed)
Name: Emily Carey  MRN/ DOB: [id number], 2000-09-12    Age/ Sex: 22 y.o., female     PCP: Patient, No Pcp Per (Inactive)   Reason for Endocrinology Evaluation: Hyperthyroidism     Initial Endocrinology Clinic Visit: 02/19/19    PATIENT IDENTIFIER: [name] is a 21 y.o., female with unremarkable past medical history. She has followed with [facility] since 02/19/2019 for consultative assistance with management of her hyperthyroidism.   HISTORICAL SUMMARY: The patient was referred after she was found to have abnormal labs at the [facility] health center of [facility] during evaluation for sinus tachycardia . Her TSh was suppressed < 0.01 uIU/mL , TT4 28.5 mcg/dL    She was started on methimazole in 02/2019 at a small dose due to leukopenia on initial presentation.  She is S/P RAI ablation on 08/04/2020 with 22.2 mCi I-131 sodium iodide orally Started on LT -4 replacement by 11/2020  Paternal grandmother with goiter  SUBJECTIVE:   Today (04/20/2021):  Emily Carey is here for a follow-up on hypothyroidism.    S/P RAI ablation in 07/2020 with 22.2 mCi I-131 sodium iodide orally   Weight has increased  Denies constipation  Denies local neck symptoms  Denies palpitations   She is on Alessa for OCP's  Levothyroxine 75 mcg daily     HISTORY:  Past Medical History: No past medical history on file. Past Surgical History: No past surgical history on file. Social History:  reports that she has never smoked. She has never used smokeless tobacco. She reports that she does not drink alcohol. No history on file for drug use. Family History:  Family History  Problem Relation Age of Onset   High blood pressure Mother    High Cholesterol Father    Diabetes Maternal Grandmother    Kidney failure Maternal Grandfather    Diabetes Paternal Grandmother    Heart failure Paternal Grandfather    Diabetes Paternal Grandfather    Kidney failure Paternal  Grandfather      HOME MEDICATIONS: Allergies as of 04/20/2021   No Known Allergies      Medication List        Accurate as of April 20, 2021  2:30 PM. If you have any questions, ask your nurse or doctor.          STOP taking these medications    levonorgestrel-ethinyl estradiol 0.1-20 MG-MCG tablet Commonly known as: ALESSE Stopped by: Scarlette Shorts, MD       TAKE these medications    Junel Fe 24 1-20 MG-MCG(24) tablet Generic drug: Norethindrone Acetate-Ethinyl Estrad-FE Take by mouth.   levothyroxine 75 MCG tablet Commonly known as: SYNTHROID Take 1 tablet (75 mcg total) by mouth daily.          OBJECTIVE:   PHYSICAL EXAM: VS: BP 130/78 (BP Location: Left Arm, Patient Position: Sitting, Cuff Size: Small)    Pulse 78    Ht 5\' 4"  (1.626 m)    Wt 244 lb (110.7 kg)    SpO2 99%    BMI 41.88 kg/m    EXAM: General: Pt appears well and is in NAD  Neck: General: Supple without adenopathy. Thyroid: Thyroid size is prominent.  No nodules appreciated. No thyroid bruit.  Lungs: Clear with good BS bilat with no rales, rhonchi, or wheezes  Heart: Auscultation: RRR.  Abdomen: Normoactive bowel sounds, soft, nontender, without masses or organomegaly palpable  Extremities:  BL LE: No pretibial edema normal ROM and strength.  Mental Status: Judgment, insight: Intact Orientation: Oriented to time, place, and person Mood and affect: No depression, anxiety, or agitation     DATA REVIEWED:  Latest Reference Range & Units 04/20/21 11:34  TSH 0.35 - 5.50 uIU/mL 3.14  T4,Free(Direct) 0.60 - 1.60 ng/dL 3.32     Results for AZARIAH, LATENDRESSE (MRN [id number]) as of 05/26/2019 14:21  Ref. Range 02/19/2019 13:46  TRAB Latest Ref Range: <=2.00 IU/L 6.00 (H)    ASSESSMENT / PLAN / RECOMMENDATIONS:   Postablative Hypothyroidism :   - S/P RAI ablation 07/2020 - Pre-conception counseling done to increase LT-4 replacement dose by 20 % with positive pregnancy test  and to notify endocrinology  - Pt is clinically euthyroid - No local neck symptoms -TFTs normal    Medications  Continue levothyroxine 75 mcg daily    2.  Graves' disease  No extrathyroidal manifestations of Graves' disease.   Follow-up in 6 months  Labs in 8 weeks     Signed electronically by: Lyndle Herrlich, MD  [facility] Endocrinology  [facility] [address] [place], [place] [address] Phone: [phone] FAX: [phone]      CC: Patient, No Pcp Per (Inactive) No address on file Phone: None  Fax: None   Return to Endocrinology clinic as below: Future Appointments  Date Time Provider Department Center  06/22/2021  1:45 PM [facility] None  10/19/2021  3:00 PM Kyona Chauncey, Konrad Dolores, MD [facility] None

## 2021-06-22 ENCOUNTER — Other Ambulatory Visit: Payer: Self-pay

## 2021-06-22 ENCOUNTER — Other Ambulatory Visit (INDEPENDENT_AMBULATORY_CARE_PROVIDER_SITE_OTHER): Payer: BC Managed Care – PPO

## 2021-06-22 DIAGNOSIS — E890 Postprocedural hypothyroidism: Secondary | ICD-10-CM

## 2021-06-22 DIAGNOSIS — E0500 Thyrotoxicosis with diffuse goiter without thyrotoxic crisis or storm: Secondary | ICD-10-CM

## 2021-06-22 LAB — TSH: TSH: 3.66 u[IU]/mL (ref 0.35–5.50)

## 2021-06-22 LAB — T4, FREE: Free T4: 0.82 ng/dL (ref 0.60–1.60)

## 2021-07-21 DIAGNOSIS — M5459 Other low back pain: Secondary | ICD-10-CM | POA: Diagnosis not present

## 2021-07-27 DIAGNOSIS — E039 Hypothyroidism, unspecified: Secondary | ICD-10-CM | POA: Diagnosis not present

## 2021-07-27 DIAGNOSIS — M549 Dorsalgia, unspecified: Secondary | ICD-10-CM | POA: Diagnosis not present

## 2021-07-27 DIAGNOSIS — Z6841 Body Mass Index (BMI) 40.0 and over, adult: Secondary | ICD-10-CM | POA: Diagnosis not present

## 2021-07-27 DIAGNOSIS — N390 Urinary tract infection, site not specified: Secondary | ICD-10-CM | POA: Diagnosis not present

## 2021-07-27 DIAGNOSIS — A499 Bacterial infection, unspecified: Secondary | ICD-10-CM | POA: Diagnosis not present

## 2021-07-27 DIAGNOSIS — G8929 Other chronic pain: Secondary | ICD-10-CM | POA: Diagnosis not present

## 2021-07-27 DIAGNOSIS — Z0000 Encounter for general adult medical examination without abnormal findings: Secondary | ICD-10-CM | POA: Diagnosis not present

## 2021-07-27 DIAGNOSIS — Z0130 Encounter for examination of blood pressure without abnormal findings: Secondary | ICD-10-CM | POA: Diagnosis not present

## 2021-07-27 DIAGNOSIS — R5383 Other fatigue: Secondary | ICD-10-CM | POA: Diagnosis not present

## 2021-07-27 DIAGNOSIS — R300 Dysuria: Secondary | ICD-10-CM | POA: Diagnosis not present

## 2021-10-19 ENCOUNTER — Ambulatory Visit: Payer: Self-pay | Admitting: Internal Medicine

## 2021-10-19 NOTE — Progress Notes (Deleted)
Name: Emily Carey  MRN/ DOB: [id number], 2001-02-03    Age/ Sex: 21 y.o., female     PCP: Patient, No Pcp Per   Reason for Endocrinology Evaluation: Hyperthyroidism     Initial Endocrinology Clinic Visit: 02/19/19    PATIENT IDENTIFIER: Emily Carey is a 21 y.o., female with unremarkable past medical history. She has followed with [facility] since 02/19/2019 for consultative assistance with management of her hyperthyroidism.   HISTORICAL SUMMARY: The patient was referred after she was found to have abnormal labs at the [facility] center of [facility] during evaluation for sinus tachycardia . Her TSh was suppressed < 0.01 uIU/mL , TT4 28.5 mcg/dL    She was started on methimazole in 02/2019 at a small dose due to leukopenia on initial presentation.  She is S/P RAI ablation on 08/04/2020 with 22.2 mCi I-131 sodium iodide orally Started on LT -4 replacement by 11/2020  Paternal grandmother with goiter  SUBJECTIVE:   Today (10/19/2021):  Emily Carey is here for a follow-up on hypothyroidism.    S/P RAI ablation in 07/2020 with 22.2 mCi I-131 sodium iodide orally   Weight has increased  Denies constipation  Denies local neck symptoms  Denies palpitations   She is on Alessa for OCP's  Levothyroxine 75 mcg daily     HISTORY:  Past Medical History: No past medical history on file. Past Surgical History: No past surgical history on file. Social History:  reports that she has never smoked. She has never used smokeless tobacco. She reports that she does not drink alcohol. No history on file for drug use. Family History:  Family History  Problem Relation Age of Onset   High blood pressure Mother    High Cholesterol Father    Diabetes Maternal Grandmother    Kidney failure Maternal Grandfather    Diabetes Paternal Grandmother    Heart failure Paternal Grandfather    Diabetes Paternal Grandfather    Kidney failure Paternal Grandfather       HOME MEDICATIONS: Allergies as of 10/19/2021   No Known Allergies      Medication List        Accurate as of October 19, 2021  2:02 PM. If you have any questions, ask your nurse or doctor.          Junel Fe 24 1-20 MG-MCG(24) tablet Generic drug: Norethindrone Acetate-Ethinyl Estrad-FE Take by mouth.   levothyroxine 75 MCG tablet Commonly known as: SYNTHROID Take 1 tablet (75 mcg total) by mouth daily.          OBJECTIVE:   PHYSICAL EXAM: VS: There were no vitals taken for this visit.   EXAM: General: Pt appears well and is in NAD  Neck: General: Supple without adenopathy. Thyroid: Thyroid size is prominent.  No nodules appreciated. No thyroid bruit.  Lungs: Clear with good BS bilat with no rales, rhonchi, or wheezes  Heart: Auscultation: RRR.  Abdomen: Normoactive bowel sounds, soft, nontender, without masses or organomegaly palpable  Extremities:  BL LE: No pretibial edema normal ROM and strength.  Mental Status: Judgment, insight: Intact Orientation: Oriented to time, place, and person Mood and affect: No depression, anxiety, or agitation     DATA REVIEWED:  Latest Reference Range & Units 04/20/21 11:34  TSH 0.35 - 5.50 uIU/mL 3.14  T4,Free(Direct) 0.60 - 1.60 ng/dL 4.58     Results for Emily, Carey (MRN [id number]) as of 05/26/2019 14:21  Ref. Range 02/19/2019 13:46  TRAB Latest Ref  Range: <=2.00 IU/L 6.00 (H)    ASSESSMENT / PLAN / RECOMMENDATIONS:   Postablative Hypothyroidism :   - S/P RAI ablation 07/2020 - Pre-conception counseling done to increase LT-4 replacement dose by 20 % with positive pregnancy test and to notify endocrinology  - Pt is clinically euthyroid - No local neck symptoms -TFTs normal    Medications  Continue levothyroxine 75 mcg daily    2.  Graves' disease  No extrathyroidal manifestations of Graves' disease.   Follow-up in 6 months     Signed electronically by: Lyndle Herrlich,  MD  [facility]inology  [facility] [address] [place], [place] [address] Phone: [phone] FAX: [phone]      CC: Patient, No Pcp Per No address on file Phone: None  Fax: None   Return to Endocrinology clinic as below: Future Appointments  Date Time Provider Department Center  10/19/2021  3:00 PM Shanee Batch, Konrad Dolores, MD [facility] None

## 2021-10-23 ENCOUNTER — Encounter: Payer: Self-pay | Admitting: Internal Medicine

## 2021-10-23 ENCOUNTER — Ambulatory Visit (INDEPENDENT_AMBULATORY_CARE_PROVIDER_SITE_OTHER): Payer: BC Managed Care – PPO | Admitting: Internal Medicine

## 2021-10-23 VITALS — BP 126/84 | HR 100 | Ht 64.0 in | Wt 255.0 lb

## 2021-10-23 DIAGNOSIS — E890 Postprocedural hypothyroidism: Secondary | ICD-10-CM | POA: Diagnosis not present

## 2021-10-23 DIAGNOSIS — E0500 Thyrotoxicosis with diffuse goiter without thyrotoxic crisis or storm: Secondary | ICD-10-CM | POA: Diagnosis not present

## 2021-10-23 DIAGNOSIS — Z8669 Personal history of other diseases of the nervous system and sense organs: Secondary | ICD-10-CM | POA: Diagnosis not present

## 2021-10-23 LAB — T4, FREE: Free T4: 0.87 ng/dL (ref 0.60–1.60)

## 2021-10-23 LAB — TSH: TSH: 3.22 u[IU]/mL (ref 0.35–5.50)

## 2021-10-23 MED ORDER — LEVOTHYROXINE SODIUM 75 MCG PO TABS: 75.0000 ug | ORAL_TABLET | Freq: Every day | ORAL | 3 refills | Status: DC

## 2021-10-23 NOTE — Progress Notes (Signed)
Name: Emily Carey  MRN/ DOB: [id number], 02/20/2001    Age/ Sex: 21 y.o., female     PCP: Patient, No Pcp Per   Reason for Endocrinology Evaluation: Hyperthyroidism     Initial Endocrinology Clinic Visit: 02/19/19    PATIENT IDENTIFIER: Ms. Kaleiah Kutzer is a 21 y.o., female with unremarkable past medical history. She has followed with [facility] since 02/19/2019 for consultative assistance with management of her hyperthyroidism.   HISTORICAL SUMMARY: The patient was referred after she was found to have abnormal labs at the [facility] during evaluation for sinus tachycardia . Her TSh was suppressed < 0.01 uIU/mL , TT4 28.5 mcg/dL    She was started on methimazole in 02/2019 at a small dose due to leukopenia on initial presentation.  She is S/P RAI ablation on 08/04/2020 with 22.2 mCi I-131 sodium iodide orally Started on LT -4 replacement by 11/2020  Paternal grandmother with goiter  SUBJECTIVE:   Today (10/23/2021):  Ms. Ordway is here for a follow-up on hypothyroidism.    S/P RAI ablation in 07/2020 with 22.2 mCi I-131 sodium iodide orally   Weight has increased  Denies constipation  Denies local neck symptoms  Denies palpitations  Has noted headaches for the past 4 months , she has hx of migraine , the headaches are located over the right eye, no recent changes in vision, Ibuprofen helps with the pain, throbbing pain , 1-2 a month  Denies congestion  Sleeps well at night   She is on Alessa for OCP's    Levothyroxine 75 mcg daily     HISTORY:  Past Medical History: No past medical history on file. Past Surgical History: No past surgical history on file. Social History:  reports that she has never smoked. She has never used smokeless tobacco. She reports that she does not drink alcohol. No history on file for drug use. Family History:  Family History  Problem Relation Age of Onset   High blood pressure Mother     High Cholesterol Father    Diabetes Maternal Grandmother    Kidney failure Maternal Grandfather    Diabetes Paternal Grandmother    Heart failure Paternal Grandfather    Diabetes Paternal Grandfather    Kidney failure Paternal Grandfather      HOME MEDICATIONS: Allergies as of 10/23/2021   No Known Allergies      Medication List        Accurate as of October 23, 2021  2:02 PM. If you have any questions, ask your nurse or doctor.          Junel Fe 24 1-20 MG-MCG(24) tablet Generic drug: Norethindrone Acetate-Ethinyl Estrad-FE Take by mouth.   levothyroxine 75 MCG tablet Commonly known as: SYNTHROID Take 1 tablet (75 mcg total) by mouth daily.          OBJECTIVE:   PHYSICAL EXAM: VS: BP 126/84 (BP Location: Left Arm, Patient Position: Sitting, Cuff Size: Large)   Pulse 100   Ht 5\' 4"  (1.626 m)   Wt 255 lb (115.7 kg)   SpO2 95%   BMI 43.77 kg/m    EXAM: General: Pt appears well and is in NAD  Neck: General: Supple without adenopathy. Thyroid: Thyroid size is prominent.  No nodules appreciated.  Lungs: Clear with good BS bilat with no rales, rhonchi, or wheezes  Heart: Auscultation: RRR.  Abdomen: Normoactive bowel sounds, soft, nontender, without masses or organomegaly palpable  Extremities:  BL LE:  No pretibial edema normal ROM and strength.  Mental Status: Judgment, insight: Intact Orientation: Oriented to time, place, and person Mood and affect: No depression, anxiety, or agitation     DATA REVIEWED:  Latest Reference Range & Units 10/23/21 10:36  TSH 0.35 - 5.50 uIU/mL 3.22  T4,Free(Direct) 0.60 - 1.60 ng/dL 6.54    Results for JAYLON, GRODE (MRN [id number]) as of 05/26/2019 14:21  Ref. Range 02/19/2019 13:46  TRAB Latest Ref Range: <=2.00 IU/L 6.00 (H)    ASSESSMENT / PLAN / RECOMMENDATIONS:   Postablative Hypothyroidism :   - S/P RAI ablation 07/2020 - Pre-conception counseling done to increase LT-4 replacement dose by 20 % with positive  pregnancy test and to notify endocrinology  - Pt is clinically euthyroid - No local neck symptoms -TFTs normal     Medications  Continue levothyroxine 75 mcg daily    2.  Graves' disease  No extrathyroidal manifestations of Graves' disease.  3. Headaches :   - She has hx of migraine headaches, these have not been as frequent 1-2x a month - Ibuprofen helps - Discussed other D/D tension headaches and rebound headaches, pre-menstrual headaches   - I have advised her to keep a food diary     Follow-up in 6 months     Signed electronically by: Lyndle Herrlich, MD  [facility] End[facility]inology  [facility] [address] [place] [place], [place] [address] Phone: [phone] FAX: [phone]      CC: Patient, No Pcp Per No address on file Phone: None  Fax: None   Return to Endocrinology clinic as below: Future Appointments  Date Time Provider Department Center  04/25/2022  9:30 AM Magdelena Kinsella, Konrad Dolores, MD [id number] None

## 2021-10-23 NOTE — Patient Instructions (Signed)

## 2022-04-15 DIAGNOSIS — Z3009 Encounter for other general counseling and advice on contraception: Secondary | ICD-10-CM | POA: Diagnosis not present

## 2022-04-15 DIAGNOSIS — Z113 Encounter for screening for infections with a predominantly sexual mode of transmission: Secondary | ICD-10-CM | POA: Diagnosis not present

## 2022-04-15 DIAGNOSIS — Z114 Encounter for screening for human immunodeficiency virus [HIV]: Secondary | ICD-10-CM | POA: Diagnosis not present

## 2022-04-15 DIAGNOSIS — A5901 Trichomonal vulvovaginitis: Secondary | ICD-10-CM | POA: Diagnosis not present

## 2022-04-24 NOTE — Progress Notes (Signed)
Name: Emily Carey  MRN/ DOB: [id number], February 15, 2001    Age/ Sex: 22 y.o., female     PCP: Patient, No Pcp Per   Reason for Endocrinology Evaluation: Hyperthyroidism     Initial Endocrinology Clinic Visit: 02/19/19    PATIENT IDENTIFIER: Emily Carey is a 22 y.o., female with unremarkable past medical history. She has followed with [facility] since 02/19/2019 for consultative assistance with management of her hyperthyroidism.   HISTORICAL SUMMARY: The patient was referred after she was found to have abnormal labs at the [facility] [place] during evaluation for sinus tachycardia . Her TSh was suppressed < 0.01 uIU/mL , TT4 28.5 mcg/dL    She was started on methimazole in 02/2019 at a small dose due to leukopenia on initial presentation.  She is S/P RAI ablation on 08/04/2020 with 22.2 mCi I-131 sodium iodide orally Started on LT -4 replacement by 11/2020  Paternal grandmother with goiter  SUBJECTIVE:   Today (04/25/2022):  Emily Carey is here for a follow-up on hypothyroidism.    S/P RAI ablation in 07/2020 with 22.2 mCi I-131 sodium iodide orally   Weight has been fluctuating  A couple of weeks ago had diarrhea but this has resolved  Denies local neck symptoms  Denies palpitations  Denies palpitations  She is on Alessa for OCP's  Denies burning and itching of eyes  Levothyroxine 75 mcg daily     HISTORY:  Past Medical History: No past medical history on file. Past Surgical History: No past surgical history on file. Social History:  reports that she has never smoked. She has never used smokeless tobacco. She reports that she does not drink alcohol. No history on file for drug use. Family History:  Family History  Problem Relation Age of Onset   High blood pressure Mother    High Cholesterol Father    Diabetes Maternal Grandmother    Kidney failure Maternal Grandfather    Diabetes Paternal Grandmother    Heart failure  Paternal Grandfather    Diabetes Paternal Grandfather    Kidney failure Paternal Grandfather      HOME MEDICATIONS: Allergies as of 04/25/2022   No Known Allergies      Medication List        Accurate as of April 25, 2022  9:46 AM. If you have any questions, ask your nurse or doctor.          Junel Fe 24 1-20 MG-MCG(24) tablet Generic drug: Norethindrone Acetate-Ethinyl Estrad-FE Take by mouth.   levothyroxine 75 MCG tablet Commonly known as: SYNTHROID Take 1 tablet (75 mcg total) by mouth daily.          OBJECTIVE:   PHYSICAL EXAM: VS: BP 114/72 (BP Location: Left Arm, Patient Position: Sitting, Cuff Size: Large)   Pulse 95   Ht 5\' 4"  (1.626 m)   Wt 257 lb (116.6 kg)   SpO2 99%   BMI 44.11 kg/m    EXAM: General: Pt appears well and is in NAD  Neck: General: Supple without adenopathy. Thyroid: Thyroid size is prominent.  No nodules appreciated.  Lungs: Clear with good BS bilat with no rales, rhonchi, or wheezes  Heart: Auscultation: RRR.  Abdomen: Normoactive bowel sounds, soft, nontender, without masses or organomegaly palpable  Extremities:  BL LE: No pretibial edema normal ROM and strength.  Mental Status: Judgment, insight: Intact Orientation: Oriented to time, place, and person Mood and affect: No depression, anxiety, or agitation  DATA REVIEWED:  Latest Reference Range & Units 04/25/22 09:59  TSH 0.35 - 5.50 uIU/mL 2.85    Results for Emily Carey (MRN [id number]) as of 05/26/2019 14:21  Ref. Range 02/19/2019 13:46  TRAB Latest Ref Range: <=2.00 IU/L 6.00 (H)      ASSESSMENT / PLAN / RECOMMENDATIONS:   Postablative Hypothyroidism :   - S/P RAI ablation 07/2020 - Pre-conception counseling done to increase LT-4 replacement dose by 20 % with positive pregnancy test and to notify endocrinology  - Pt is clinically euthyroid - No local neck symptoms -TFTs normal     Medications  Continue levothyroxine 75 mcg daily    2.   Graves' disease  No extrathyroidal manifestations of Graves' disease.     Follow-up in 1 yr     Signed electronically by: Mack Guise, MD  [facility] Endocrinology  [facility] [address] [place], [place] [address] Phone: [phone] FAX: [phone]      CC: Patient, No Pcp Per No address on file Phone: None  Fax: None   Return to Endocrinology clinic as below: No future appointments.

## 2022-04-25 ENCOUNTER — Ambulatory Visit (INDEPENDENT_AMBULATORY_CARE_PROVIDER_SITE_OTHER): Payer: BLUE CROSS/BLUE SHIELD | Admitting: Internal Medicine

## 2022-04-25 ENCOUNTER — Encounter: Payer: Self-pay | Admitting: Internal Medicine

## 2022-04-25 VITALS — BP 114/72 | HR 95 | Ht 64.0 in | Wt 257.0 lb

## 2022-04-25 DIAGNOSIS — E0500 Thyrotoxicosis with diffuse goiter without thyrotoxic crisis or storm: Secondary | ICD-10-CM

## 2022-04-25 DIAGNOSIS — E890 Postprocedural hypothyroidism: Secondary | ICD-10-CM | POA: Diagnosis not present

## 2022-04-25 LAB — TSH: TSH: 2.85 u[IU]/mL (ref 0.35–5.50)

## 2022-04-25 MED ORDER — LEVOTHYROXINE SODIUM 75 MCG PO TABS: 75.0000 ug | ORAL_TABLET | Freq: Every day | ORAL | 3 refills | Status: AC

## 2022-04-25 NOTE — Patient Instructions (Signed)

## 2022-05-29 DIAGNOSIS — M545 Low back pain, unspecified: Secondary | ICD-10-CM | POA: Diagnosis not present

## 2022-05-29 DIAGNOSIS — M2560 Stiffness of unspecified joint, not elsewhere classified: Secondary | ICD-10-CM | POA: Diagnosis not present

## 2022-05-29 DIAGNOSIS — R293 Abnormal posture: Secondary | ICD-10-CM | POA: Diagnosis not present

## 2022-05-29 DIAGNOSIS — M6281 Muscle weakness (generalized): Secondary | ICD-10-CM | POA: Diagnosis not present

## 2022-05-31 DIAGNOSIS — M6281 Muscle weakness (generalized): Secondary | ICD-10-CM | POA: Diagnosis not present

## 2022-05-31 DIAGNOSIS — M545 Low back pain, unspecified: Secondary | ICD-10-CM | POA: Diagnosis not present

## 2022-05-31 DIAGNOSIS — R293 Abnormal posture: Secondary | ICD-10-CM | POA: Diagnosis not present

## 2022-05-31 DIAGNOSIS — M2560 Stiffness of unspecified joint, not elsewhere classified: Secondary | ICD-10-CM | POA: Diagnosis not present

## 2022-06-05 DIAGNOSIS — R293 Abnormal posture: Secondary | ICD-10-CM | POA: Diagnosis not present

## 2022-06-05 DIAGNOSIS — M2560 Stiffness of unspecified joint, not elsewhere classified: Secondary | ICD-10-CM | POA: Diagnosis not present

## 2022-06-05 DIAGNOSIS — M545 Low back pain, unspecified: Secondary | ICD-10-CM | POA: Diagnosis not present

## 2022-06-05 DIAGNOSIS — M6281 Muscle weakness (generalized): Secondary | ICD-10-CM | POA: Diagnosis not present

## 2022-06-07 DIAGNOSIS — M6281 Muscle weakness (generalized): Secondary | ICD-10-CM | POA: Diagnosis not present

## 2022-06-07 DIAGNOSIS — M545 Low back pain, unspecified: Secondary | ICD-10-CM | POA: Diagnosis not present

## 2022-06-07 DIAGNOSIS — R293 Abnormal posture: Secondary | ICD-10-CM | POA: Diagnosis not present

## 2022-06-07 DIAGNOSIS — M2560 Stiffness of unspecified joint, not elsewhere classified: Secondary | ICD-10-CM | POA: Diagnosis not present

## 2022-06-19 DIAGNOSIS — R293 Abnormal posture: Secondary | ICD-10-CM | POA: Diagnosis not present

## 2022-06-19 DIAGNOSIS — M2560 Stiffness of unspecified joint, not elsewhere classified: Secondary | ICD-10-CM | POA: Diagnosis not present

## 2022-06-19 DIAGNOSIS — M6281 Muscle weakness (generalized): Secondary | ICD-10-CM | POA: Diagnosis not present

## 2022-06-19 DIAGNOSIS — M545 Low back pain, unspecified: Secondary | ICD-10-CM | POA: Diagnosis not present

## 2022-06-21 DIAGNOSIS — R293 Abnormal posture: Secondary | ICD-10-CM | POA: Diagnosis not present

## 2022-06-21 DIAGNOSIS — M545 Low back pain, unspecified: Secondary | ICD-10-CM | POA: Diagnosis not present

## 2022-06-21 DIAGNOSIS — M6281 Muscle weakness (generalized): Secondary | ICD-10-CM | POA: Diagnosis not present

## 2022-06-21 DIAGNOSIS — M2560 Stiffness of unspecified joint, not elsewhere classified: Secondary | ICD-10-CM | POA: Diagnosis not present

## 2022-06-26 DIAGNOSIS — M545 Low back pain, unspecified: Secondary | ICD-10-CM | POA: Diagnosis not present

## 2022-06-26 DIAGNOSIS — M6281 Muscle weakness (generalized): Secondary | ICD-10-CM | POA: Diagnosis not present

## 2022-06-26 DIAGNOSIS — M2560 Stiffness of unspecified joint, not elsewhere classified: Secondary | ICD-10-CM | POA: Diagnosis not present

## 2022-06-26 DIAGNOSIS — R293 Abnormal posture: Secondary | ICD-10-CM | POA: Diagnosis not present

## 2022-06-28 DIAGNOSIS — M545 Low back pain, unspecified: Secondary | ICD-10-CM | POA: Diagnosis not present

## 2022-06-28 DIAGNOSIS — M6281 Muscle weakness (generalized): Secondary | ICD-10-CM | POA: Diagnosis not present

## 2022-06-28 DIAGNOSIS — R293 Abnormal posture: Secondary | ICD-10-CM | POA: Diagnosis not present

## 2022-06-28 DIAGNOSIS — M2560 Stiffness of unspecified joint, not elsewhere classified: Secondary | ICD-10-CM | POA: Diagnosis not present

## 2022-07-03 DIAGNOSIS — M2560 Stiffness of unspecified joint, not elsewhere classified: Secondary | ICD-10-CM | POA: Diagnosis not present

## 2022-07-03 DIAGNOSIS — M6281 Muscle weakness (generalized): Secondary | ICD-10-CM | POA: Diagnosis not present

## 2022-07-03 DIAGNOSIS — R293 Abnormal posture: Secondary | ICD-10-CM | POA: Diagnosis not present

## 2022-07-03 DIAGNOSIS — M545 Low back pain, unspecified: Secondary | ICD-10-CM | POA: Diagnosis not present

## 2022-07-04 DIAGNOSIS — M6281 Muscle weakness (generalized): Secondary | ICD-10-CM | POA: Diagnosis not present

## 2022-07-04 DIAGNOSIS — R293 Abnormal posture: Secondary | ICD-10-CM | POA: Diagnosis not present

## 2022-07-04 DIAGNOSIS — M545 Low back pain, unspecified: Secondary | ICD-10-CM | POA: Diagnosis not present

## 2022-07-04 DIAGNOSIS — M2560 Stiffness of unspecified joint, not elsewhere classified: Secondary | ICD-10-CM | POA: Diagnosis not present

## 2022-07-06 ENCOUNTER — Other Ambulatory Visit: Payer: Self-pay

## 2022-07-06 ENCOUNTER — Emergency Department (HOSPITAL_COMMUNITY)
Admission: EM | Admit: 2022-07-06 | Discharge: 2022-07-06 | Disposition: A | Discharge status: Home / Self Care | Payer: BLUE CROSS/BLUE SHIELD | Attending: Emergency Medicine | Admitting: Emergency Medicine

## 2022-07-06 ENCOUNTER — Emergency Department (HOSPITAL_COMMUNITY): Payer: BLUE CROSS/BLUE SHIELD

## 2022-07-06 ENCOUNTER — Encounter (HOSPITAL_COMMUNITY): Payer: Self-pay

## 2022-07-06 DIAGNOSIS — S01511A Laceration without foreign body of lip, initial encounter: Secondary | ICD-10-CM | POA: Diagnosis not present

## 2022-07-06 DIAGNOSIS — S0993XA Unspecified injury of face, initial encounter: Secondary | ICD-10-CM | POA: Diagnosis present

## 2022-07-06 DIAGNOSIS — S5011XA Contusion of right forearm, initial encounter: Secondary | ICD-10-CM | POA: Insufficient documentation

## 2022-07-06 DIAGNOSIS — Y92410 Unspecified street and highway as the place of occurrence of the external cause: Secondary | ICD-10-CM | POA: Diagnosis not present

## 2022-07-06 DIAGNOSIS — M79601 Pain in right arm: Secondary | ICD-10-CM

## 2022-07-06 DIAGNOSIS — S41111A Laceration without foreign body of right upper arm, initial encounter: Secondary | ICD-10-CM | POA: Diagnosis not present

## 2022-07-06 HISTORY — DX: Disorder of thyroid, unspecified: E07.9
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
## 2022-07-10 DIAGNOSIS — R293 Abnormal posture: Secondary | ICD-10-CM | POA: Diagnosis not present

## 2022-07-10 DIAGNOSIS — M6281 Muscle weakness (generalized): Secondary | ICD-10-CM | POA: Diagnosis not present

## 2022-07-10 DIAGNOSIS — M2560 Stiffness of unspecified joint, not elsewhere classified: Secondary | ICD-10-CM | POA: Diagnosis not present

## 2022-07-10 DIAGNOSIS — M5451 Vertebrogenic low back pain: Secondary | ICD-10-CM | POA: Diagnosis not present

## 2022-07-12 DIAGNOSIS — M2560 Stiffness of unspecified joint, not elsewhere classified: Secondary | ICD-10-CM | POA: Diagnosis not present

## 2022-07-12 DIAGNOSIS — R293 Abnormal posture: Secondary | ICD-10-CM | POA: Diagnosis not present

## 2022-07-12 DIAGNOSIS — M6281 Muscle weakness (generalized): Secondary | ICD-10-CM | POA: Diagnosis not present

## 2022-07-12 DIAGNOSIS — M5451 Vertebrogenic low back pain: Secondary | ICD-10-CM | POA: Diagnosis not present

## 2023-04-28 ENCOUNTER — Ambulatory Visit: Payer: BLUE CROSS/BLUE SHIELD | Admitting: Internal Medicine

## 2023-06-30 ENCOUNTER — Ambulatory Visit: Payer: BLUE CROSS/BLUE SHIELD | Admitting: Internal Medicine
